# Patient Record
Sex: Male | Born: 2005 | Race: Black or African American | Hispanic: No | Marital: Single
Health system: Southern US, Community
[De-identification: ages and names within clinical notes are randomized; demographics above are authoritative.]

---
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
## 2024-06-08 ENCOUNTER — Ambulatory Visit: Payer: Self-pay | Admitting: Family Medicine

## 2024-06-11 ENCOUNTER — Telehealth: Payer: Self-pay | Admitting: Family Medicine

## 2024-06-11 NOTE — Telephone Encounter (Signed)
 Left vm offering to r/s appt that was missed on 06/08/2024.
